# Patient Record
Sex: Female | Born: 2005 | Race: White | Hispanic: No | Marital: Single | Smoking: Never smoker
Health system: Southern US, Community
[De-identification: ages and names within clinical notes are randomized; demographics above are authoritative.]

## PROBLEM LIST (undated history)

## (undated) DIAGNOSIS — K900 Celiac disease: Secondary | ICD-10-CM

## (undated) HISTORY — DX: Celiac disease: K90.0

## (undated) HISTORY — PX: MULTIPLE TOOTH EXTRACTIONS: SHX2053

---
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
## 2024-02-06 DIAGNOSIS — R350 Frequency of micturition: Secondary | ICD-10-CM | POA: Diagnosis not present

## 2024-02-06 DIAGNOSIS — N760 Acute vaginitis: Secondary | ICD-10-CM | POA: Diagnosis not present

## 2024-02-06 DIAGNOSIS — R300 Dysuria: Secondary | ICD-10-CM | POA: Diagnosis not present

## 2024-03-22 DIAGNOSIS — R350 Frequency of micturition: Secondary | ICD-10-CM | POA: Diagnosis not present

## 2024-03-22 DIAGNOSIS — N760 Acute vaginitis: Secondary | ICD-10-CM | POA: Diagnosis not present
